# Patient Record
Sex: Male | Born: 1990 | Race: White | Hispanic: Yes | Smoking: Never smoker
Health system: Southern US, Community
[De-identification: ages and names within clinical notes are randomized; demographics above are authoritative.]

## PROBLEM LIST (undated history)

## (undated) DIAGNOSIS — Q676 Pectus excavatum: Secondary | ICD-10-CM

## (undated) DIAGNOSIS — R000 Tachycardia, unspecified: Secondary | ICD-10-CM

## (undated) DIAGNOSIS — R748 Abnormal levels of other serum enzymes: Secondary | ICD-10-CM

## (undated) HISTORY — DX: Abnormal levels of other serum enzymes: R74.8

## (undated) HISTORY — DX: Tachycardia, unspecified: R00.0

## (undated) HISTORY — DX: Pectus excavatum: Q67.6

---

## 2018-11-09 ENCOUNTER — Other Ambulatory Visit: Payer: Self-pay

## 2018-11-09 DIAGNOSIS — Z20822 Contact with and (suspected) exposure to covid-19: Secondary | ICD-10-CM

## 2018-11-10 LAB — NOVEL CORONAVIRUS, NAA: SARS-CoV-2, NAA: NOT DETECTED

## 2018-11-28 ENCOUNTER — Other Ambulatory Visit: Payer: Self-pay

## 2018-11-28 DIAGNOSIS — Z20822 Contact with and (suspected) exposure to covid-19: Secondary | ICD-10-CM

## 2018-11-29 LAB — NOVEL CORONAVIRUS, NAA: SARS-CoV-2, NAA: NOT DETECTED

## 2018-12-10 ENCOUNTER — Encounter (HOSPITAL_COMMUNITY): Payer: Self-pay

## 2018-12-10 ENCOUNTER — Emergency Department (HOSPITAL_COMMUNITY)
Admission: EM | Admit: 2018-12-10 | Discharge: 2018-12-11 | Disposition: A | Discharge status: Home / Self Care | Payer: Self-pay | Attending: Emergency Medicine | Admitting: Emergency Medicine

## 2018-12-10 ENCOUNTER — Emergency Department (HOSPITAL_COMMUNITY): Payer: Self-pay

## 2018-12-10 DIAGNOSIS — U071 COVID-19: Secondary | ICD-10-CM | POA: Insufficient documentation

## 2018-12-10 LAB — CBC WITH DIFFERENTIAL/PLATELET
Abs Immature Granulocytes: 0.01 10*3/uL (ref 0.00–0.07)
Basophils Absolute: 0 10*3/uL (ref 0.0–0.1)
Basophils Relative: 0 %
Eosinophils Absolute: 0 10*3/uL (ref 0.0–0.5)
Eosinophils Relative: 0 %
HCT: 45.6 % (ref 39.0–52.0)
Hemoglobin: 16.1 g/dL (ref 13.0–17.0)
Immature Granulocytes: 0 %
Lymphocytes Relative: 10 %
Lymphs Abs: 0.8 10*3/uL (ref 0.7–4.0)
MCH: 31.4 pg (ref 26.0–34.0)
MCHC: 35.3 g/dL (ref 30.0–36.0)
MCV: 89.1 fL (ref 80.0–100.0)
Monocytes Absolute: 0.7 10*3/uL (ref 0.1–1.0)
Monocytes Relative: 9 %
Neutro Abs: 5.9 10*3/uL (ref 1.7–7.7)
Neutrophils Relative %: 81 %
Platelets: 256 10*3/uL (ref 150–400)
RBC: 5.12 MIL/uL (ref 4.22–5.81)
RDW: 11.9 % (ref 11.5–15.5)
WBC: 7.3 10*3/uL (ref 4.0–10.5)
nRBC: 0 % (ref 0.0–0.2)

## 2018-12-10 LAB — BASIC METABOLIC PANEL
Anion gap: 10 (ref 5–15)
BUN: 9 mg/dL (ref 6–20)
CO2: 23 mmol/L (ref 22–32)
Calcium: 9.1 mg/dL (ref 8.9–10.3)
Chloride: 103 mmol/L (ref 98–111)
Creatinine, Ser: 0.91 mg/dL (ref 0.61–1.24)
GFR calc Af Amer: >60 mL/min (ref >60)
GFR calc non Af Amer: >60 mL/min (ref >60)
Glucose, Bld: 114 mg/dL — ABNORMAL HIGH (ref 70–99)
Potassium: 3.6 mmol/L (ref 3.5–5.1)
Sodium: 136 mmol/L (ref 135–145)

## 2018-12-10 MED ORDER — SODIUM CHLORIDE 0.9 % IV BOLUS
500.0000 mL | Freq: Once | INTRAVENOUS | Status: AC
  Administered 2018-12-10: 500 mL via INTRAVENOUS

## 2018-12-10 MED ORDER — FLUTICASONE PROPIONATE 50 MCG/ACT NA SUSP: 1.0000 | Freq: Every day | NASAL | 0 refills | Status: DC

## 2018-12-10 MED ORDER — ACETAMINOPHEN 500 MG PO TABS
1000.0000 mg | ORAL_TABLET | Freq: Once | ORAL | Status: AC
  Administered 2018-12-10: 1000 mg via ORAL
  Filled 2018-12-10: qty 2

## 2018-12-10 NOTE — Discharge Instructions (Signed)
Use Flonase once daily.  You can also use Arm and Hammer Simply Saline as needed for nasal congestion. Make sure to stay well hydrated. You can alternate ibuprofen and Tylenol as prescribed over-the-counter for your fever. Please return to the emergency department if you develop and new or worsening symptoms including severe chest pain or shortness of breath, racing heart that is not resolving with control of fever and hydrating, or any other concerning symptoms.

## 2018-12-10 NOTE — ED Triage Notes (Signed)
Pt comes via [facility], began to have cough and congestion that started Saturday, Covid positive today at [facility], HR was 140, PTA received 325 ASA. Denies CP or palpations.

## 2018-12-10 NOTE — ED Provider Notes (Signed)
[facility] EMERGENCY DEPARTMENT Provider Note   CSN: [id number] Arrival date & time: 12/10/18  2018     History   Chief Complaint Chief Complaint  Patient presents with  . Tachycardia  . COVID +    HPI James Hubbard is a 28 y.o. male who is previously healthy who presents with tachycardia.  Patient reports testing positive for COVID-19 today.  He has had some associated nasal congestion, little cough, and body aches.  He denies any chest pain, shortness of breath, abdominal pain, nausea, vomiting, diarrhea.  Patient has had loss of taste and smell.  He has been taking some over-the-counter cough medications.  He denies any drug or alcohol use.  His dad tested positive for COVID-19.     HPI  History reviewed. No pertinent past medical history.  There are no active problems to display for this patient.   History reviewed. No pertinent surgical history.      Home Medications    Prior to Admission medications   Medication Sig Start Date End Date Taking? Authorizing Provider  ibuprofen (ADVIL) 200 MG tablet Take 400 mg by mouth every 6 (six) hours as needed for moderate pain.   Yes [provider]  Phenylephrine-APAP-guaiFENesin (MUCINEX SINUS-MAX PO) Take 2 tablets by mouth as needed (sinus).   Yes [provider]  Pseudoephedrine-APAP-DM (DAYQUIL PO) Take 1 Dose by mouth as needed (cold and flu). Liquid   Yes [provider]  fluticasone (FLONASE) 50 MCG/ACT nasal spray Place 1 spray into both nostrils daily. 12/10/18   Emi Holes, PA-C    Family History No family history on file.  Social History Social History   Tobacco Use  . Smoking status: Never Smoker  . Smokeless tobacco: Never Used  Substance Use Topics  . Alcohol use: Never    Frequency: Never  . Drug use: Never     Allergies   Patient has no known allergies.   Review of Systems Review of Systems  Constitutional: Negative for chills and  fever.  HENT: Positive for congestion. Negative for facial swelling and sore throat.   Respiratory: Positive for cough. Negative for shortness of breath.   Cardiovascular: Positive for palpitations. Negative for chest pain.  Gastrointestinal: Negative for abdominal pain, nausea and vomiting.  Genitourinary: Negative for dysuria.  Musculoskeletal: Positive for myalgias. Negative for back pain.  Skin: Negative for rash and wound.  Neurological: Negative for headaches.  Psychiatric/Behavioral: The patient is not nervous/anxious.      Physical Exam Updated Vital Signs BP 121/85   Pulse (!) 110   Temp 99.9 F (37.7 C) (Oral)   Resp 13   SpO2 99%   Physical Exam Vitals signs and nursing note reviewed.  Constitutional:      General: He is not in acute distress.    Appearance: He is well-developed. He is not diaphoretic.  HENT:     Head: Normocephalic and atraumatic.     Right Ear: Tympanic membrane normal.     Left Ear: Tympanic membrane normal.     Mouth/Throat:     Pharynx: No oropharyngeal exudate.  Eyes:     General: No scleral icterus.       Right eye: No discharge.        Left eye: No discharge.     Conjunctiva/sclera: Conjunctivae normal.     Pupils: Pupils are equal, round, and reactive to light.  Neck:     Musculoskeletal: Normal range of motion and neck supple.  Thyroid: No thyromegaly.  Cardiovascular:     Rate and Rhythm: Regular rhythm. Tachycardia present.     Heart sounds: Normal heart sounds. No murmur. No friction rub. No gallop.   Pulmonary:     Effort: Pulmonary effort is normal. No respiratory distress.     Breath sounds: Normal breath sounds. No stridor. No wheezing or rales.  Abdominal:     General: Bowel sounds are normal. There is no distension.     Palpations: Abdomen is soft.     Tenderness: There is no abdominal tenderness. There is no guarding or rebound.  Lymphadenopathy:     Cervical: No cervical adenopathy.  Skin:    General: Skin is  warm and dry.     Coloration: Skin is not pale.     Findings: No rash.  Neurological:     Mental Status: He is alert.     Coordination: Coordination normal.      ED Treatments / Results  Labs (all labs ordered are listed, but only abnormal results are displayed) Labs Reviewed  BASIC METABOLIC PANEL - Abnormal; Notable for the following components:      Result Value   Glucose, Bld 114 (*)    All other components within normal limits  CBC WITH DIFFERENTIAL/PLATELET    EKG None  Radiology Dg Chest Portable 1 View  Result Date: 12/10/2018 CLINICAL DATA:  Cough, COVID-19 EXAM: PORTABLE CHEST 1 VIEW COMPARISON:  02/16/2015 FINDINGS: The heart size and mediastinal contours are within normal limits. Both lungs are clear. The visualized skeletal structures are unremarkable. IMPRESSION: No acute abnormality of the lungs in AP portable projection. Electronically Signed   By: Eddie Candle M.D.   On: 12/10/2018 21:33    Procedures Procedures (including critical care time)  Medications Ordered in ED Medications  acetaminophen (TYLENOL) tablet 1,000 mg (1,000 mg Oral Given 12/10/18 2056)  sodium chloride 0.9 % bolus 500 mL (500 mLs Intravenous New Bag/Given 12/10/18 2217)     Initial Impression / Assessment and Plan / ED Course  I have reviewed the triage vital signs and the nursing notes.  Pertinent labs & imaging results that were available during my care of the patient were reviewed by me and considered in my medical decision making (see chart for details).        Patient with COVID-19 infection.  He was found to have heart rate in the 130s and 140s at urgent care.  He was sent here for further evaluation.  Patient's heart rate has improved after control of his fever and IV fluids.  Labs are unremarkable.  Chest x-ray is clear.  EKG shows sinus tachycardia.  His heart rate has improved to low 100s.  Patient is otherwise asymptomatic.  Quarantine discussed.  Flonase for nasal  congestion.  Alternation of ibuprofen and Tylenol discussed for fever.  Good hydration discussed.  Return precautions discussed.  Patient understands agrees with plan.  Patient vital stable and discharged in satisfactory condition.  I discussed patient case with Dr. Wilson Singer who guided the patient's management and agrees with plan.   Final Clinical Impressions(s) / ED Diagnoses   Final diagnoses:  [id number] virus infection    ED Discharge Orders         Ordered    fluticasone (FLONASE) 50 MCG/ACT nasal spray  Daily     12/10/18 2348           Frederica Kuster, PA-C 12/10/18 2358    Virgel Manifold, MD 12/11/18 [phone]

## 2018-12-24 ENCOUNTER — Other Ambulatory Visit: Payer: Self-pay

## 2018-12-24 DIAGNOSIS — Z20822 Contact with and (suspected) exposure to covid-19: Secondary | ICD-10-CM

## 2018-12-25 LAB — NOVEL CORONAVIRUS, NAA: SARS-CoV-2, NAA: NOT DETECTED

## 2020-08-19 ENCOUNTER — Encounter: Payer: Self-pay | Admitting: Cardiology

## 2020-09-20 DIAGNOSIS — R000 Tachycardia, unspecified: Secondary | ICD-10-CM | POA: Insufficient documentation

## 2020-09-20 NOTE — Progress Notes (Deleted)
Cardiology Office Note:    Date:  09/21/2020   ID:  James Hubbard, DOB 08/16/1990, MRN [id number]  PCP:  Patient, No Pcp Per (Inactive)  Cardiologist:  Norman Herrlich, MD   Referring MD: Buckner Malta, MD  ASSESSMENT:    1. Pectus excavatum   2. Tachycardia    PLAN:    In order of problems listed above:  ***  Next appointment   Medication Adjustments/Labs and Tests Ordered: Current medicines are reviewed at length with the patient today.  Concerns regarding medicines are outlined above.  No orders of the defined types were placed in this encounter.  No orders of the defined types were placed in this encounter.    No chief complaint on file. ***  History of Present Illness:    James Hubbard is a 30 y.o. male with a history of pectus excavatum deformity who is being seen today for the evaluation of tachycardia at the request of Buckner Malta, MD. I Reviewed office records from his PCP when seen 08/14/2019 his resting heart rate is 116 bpm respiratory rate 20 blood pressure 118/72. An EKG performed showed sinus tachycardia low voltage in the precordial leads.  Laboratory studies showed a hemoglobin normal 15.5 platelets 372,000 CMP with a creatinine 0.9 GFR greater than 90 cc potassium 3.8 sodium 141 otherwise normal free T4 TSH were normal.  He had chest x-ray performed 12/10/2018 single AP projection was read as normal.  He was seen in consultation at [facility] 09/15/2020 both pediatric cardiology and cardiothoracic surgery advised surgical correction Nuss procedure at that visit his heart rate was 89 bpm blood pressure 110/70 and was described as having a pectus excavatum deformity with chest asymmetry and tenderness over the left fourth and fifth costal chondral junction.  He was recommended to have a CT scan PFT's and TTE to further evaluation with his desire for surgical intervention.  He has arrangements to follow-up with cardiothoracic surgery  Dr.Upham 09/30/2020  Past Medical History:  Diagnosis Date   Congenital pectus excavatum    Elevated liver enzymes    Tachycardia     No past surgical history on file.  Current Medications: No outpatient medications have been marked as taking for the 09/21/20 encounter (Appointment) with Baldo Daub, MD.     Allergies:   Patient has no known allergies.   Social History   Socioeconomic History   Marital status: Significant Other    Spouse name: Not on file   Number of children: Not on file   Years of education: Not on file   Highest education level: Not on file  Occupational History   Not on file  Tobacco Use   Smoking status: Never   Smokeless tobacco: Never  Substance and Sexual Activity   Alcohol use: Never   Drug use: Never   Sexual activity: Not on file  Other Topics Concern   Not on file  Social History Narrative   Not on file   Social Determinants of Health   Financial Resource Strain: Not on file  Food Insecurity: Not on file  Transportation Needs: Not on file  Physical Activity: Not on file  Stress: Not on file  Social Connections: Not on file     Family History: The patient's ***family history is not on file.  ROS:   ROS Please see the history of present illness.    *** All other systems reviewed and are negative.  EKGs/Labs/Other Studies Reviewed:    The following studies were reviewed today: ***  EKG:  EKG is *** ordered today.  The ekg ordered today is personally reviewed and demonstrates ***  Recent Labs: No results found for requested labs within last 8760 hours.  Recent Lipid Panel No results found for: CHOL, TRIG, HDL, CHOLHDL, VLDL, LDLCALC, LDLDIRECT  Physical Exam:    VS:  There were no vitals taken for this visit.    Wt Readings from Last 3 Encounters:  08/13/20 153 lb (69.4 kg)     GEN: *** Well nourished, well developed in no acute distress HEENT: Normal NECK: No JVD; No carotid bruits LYMPHATICS: No  lymphadenopathy CARDIAC: ***RRR, no murmurs, rubs, gallops RESPIRATORY:  Clear to auscultation without rales, wheezing or rhonchi  ABDOMEN: Soft, non-tender, non-distended MUSCULOSKELETAL:  No edema; No deformity  SKIN: Warm and dry NEUROLOGIC:  Alert and oriented x 3 PSYCHIATRIC:  Normal affect     Signed, Norman Herrlich, MD  09/21/2020 12:41 PM     [facility]

## 2020-09-21 ENCOUNTER — Ambulatory Visit: Payer: 59 | Admitting: Cardiology

## 2020-09-21 DIAGNOSIS — Q676 Pectus excavatum: Secondary | ICD-10-CM

## 2020-09-21 DIAGNOSIS — R000 Tachycardia, unspecified: Secondary | ICD-10-CM

## 2021-05-21 IMAGING — DX DG CHEST 1V PORT
1 series · 1 of 1 positions shown · non-contrast
Comparison: 02/16/2015

CLINICAL DATA: Cough, [id number]

EXAM:
PORTABLE CHEST 1 VIEW

[chest ap]
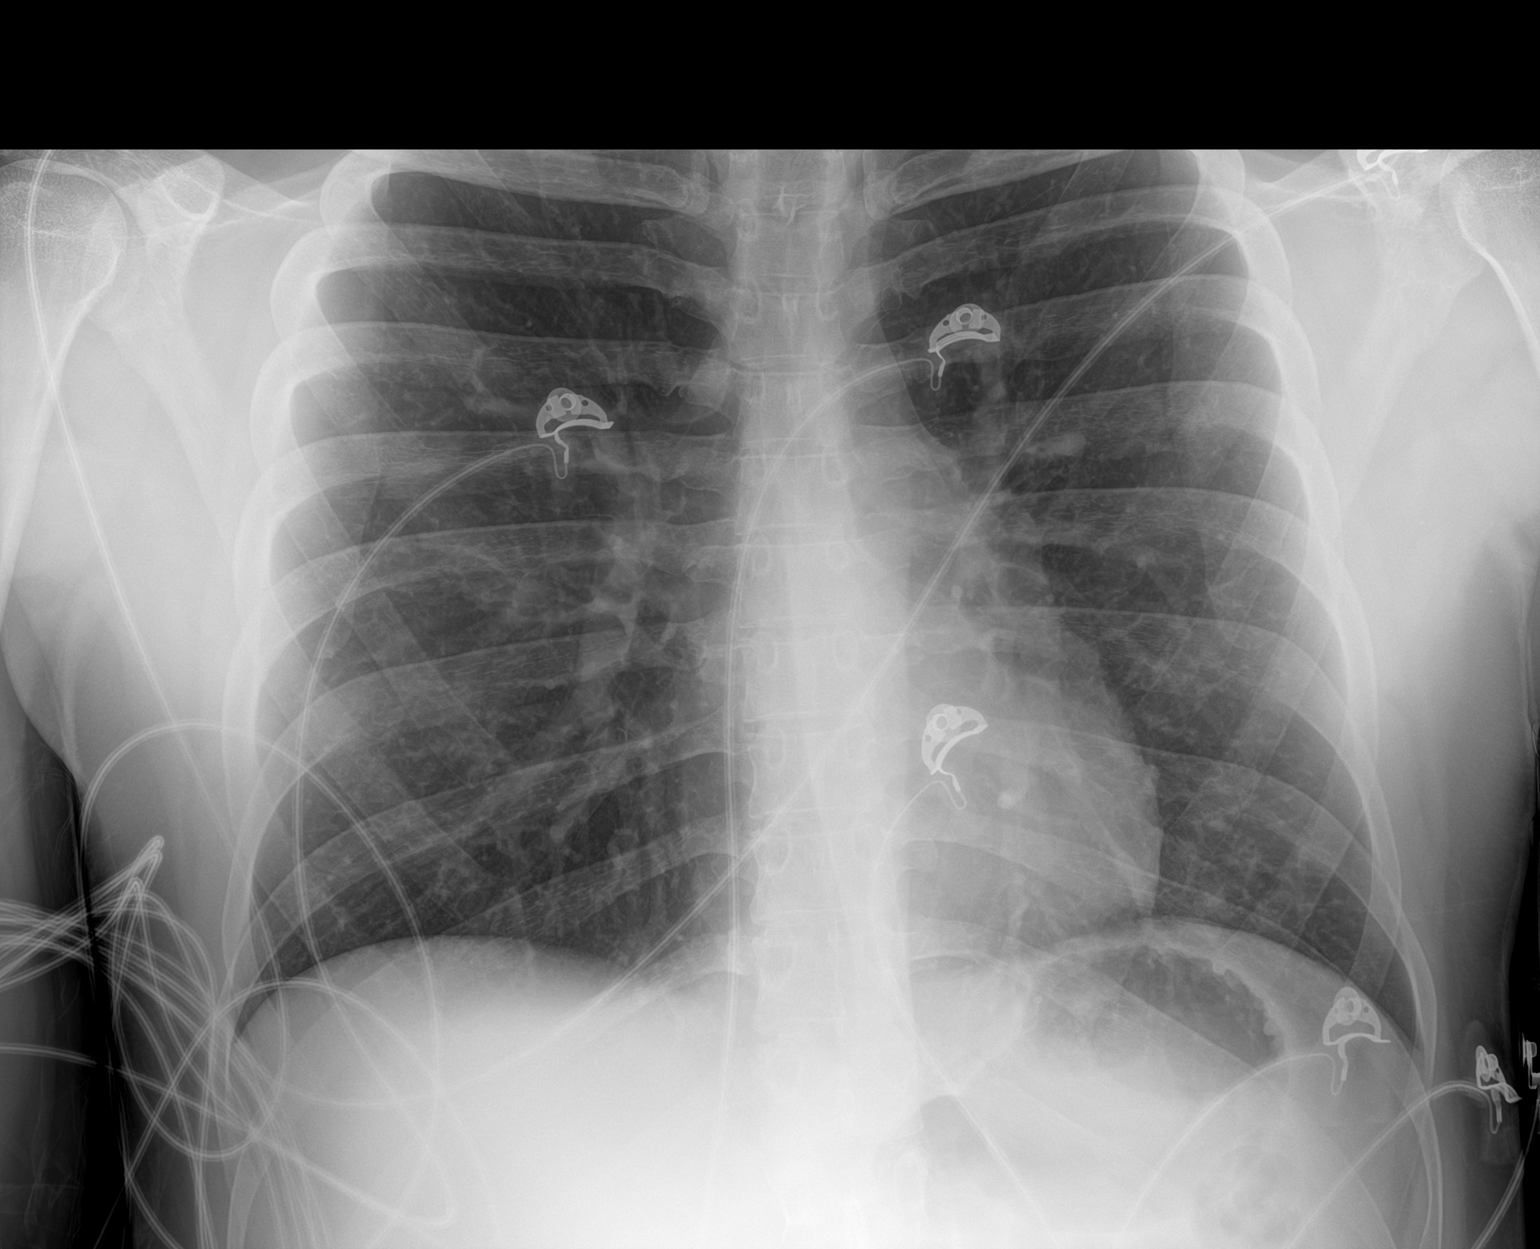

[1 of 1 positions shown; findings below may reference images not displayed]

FINDINGS: The heart size and mediastinal contours are within normal limits.
Both lungs are clear. The visualized skeletal structures are
unremarkable.
IMPRESSION: No acute abnormality of the lungs in AP portable projection.

## 2023-01-11 ENCOUNTER — Ambulatory Visit: Payer: No Typology Code available for payment source | Admitting: Podiatry

## 2023-01-12 ENCOUNTER — Ambulatory Visit (INDEPENDENT_AMBULATORY_CARE_PROVIDER_SITE_OTHER): Payer: No Typology Code available for payment source

## 2023-01-12 ENCOUNTER — Ambulatory Visit: Payer: No Typology Code available for payment source | Admitting: Podiatry

## 2023-01-12 DIAGNOSIS — M778 Other enthesopathies, not elsewhere classified: Secondary | ICD-10-CM | POA: Diagnosis not present

## 2023-01-12 DIAGNOSIS — M722 Plantar fascial fibromatosis: Secondary | ICD-10-CM

## 2023-01-12 NOTE — Patient Instructions (Signed)

## 2023-01-12 NOTE — Progress Notes (Signed)
     Chief Complaint  Patient presents with   Foot Pain    Left heel pain in the back of the ankle/heel area that radiates up into left knee. Ongoing x1 week. No known injury. Right heel pain is just in the heel area. Ongoing x1-2 weeks.    HPI: 32 y.o. male presenting today with c/o pain in the bottom of the bilateral heel.  Pain is rated as 8/10.  This has been present for the past couple weeks.  He notes the pain is starting to wrap around to the Achilles tendon area.  Denies injury.  Past Medical History:  Diagnosis Date   Congenital pectus excavatum    Elevated liver enzymes    Tachycardia     No past surgical history on file.  No Known Allergies   Physical Exam: General: The patient is alert and oriented x3 in no acute distress.  Dermatology:  No ecchymosis, erythema, or edema bilateral.  No open lesions.    Vascular: Palpable pedal pulses bilaterally. Capillary refill within normal limits.  No appreciable edema.    Neurological: Light touch sensation intact bilateral.  MMT 5/5 to lower extremity bilateral. Negative Tinel's sign with percussion of the posterior tibial nerve on the affected extremity.    Musculoskeletal Exam:  There is pain on palpation of the plantarmedial & plantarcentral aspect of bilateral heel.  No gaps or nodules within the plantar fascia.  Positive Windlass mechanism bilateral.  Antalgic gait noted with first few steps upon standing.  Minimal pain on palpation of distal achilles tendon bilateral.  Ankle df less than 10 degrees with knee extended b/l.  Radiographic Exam (bilateral foot, 3 weightbearing views, 01/12/2023):  Normal osseous mineralization.  Right foot has long first metatarsal and decreased calcaneal inclination angle with first ray elevatus noted.  Left foot has decreased calcaneal inclination angle.  No fracture is seen on either foot.  No calcaneal spur is seen on either foot.  Assessment/Plan of Care: 1. Plantar fasciitis, bilateral    2. Capsulitis of foot     -Reviewed etiology of plantar fasciitis with patient.  Discussed treatment options with patient today, including cortisone injection, NSAID course of treatment, stretching exercises, physical therapy, use of night splint, rest, icing the heel, arch supports/orthotics, and supportive shoe gear.    Patient wants to hold off on cortisone injection, physical therapy, night splint, NSAIDs.  Power step inserts were fitted and dispensed today.  Stretching exercises were printed and dispensed.  Patient is to perform these 2-3 times daily.  Patient is interested in custom orthotics.  Will get him scheduled for an orthotic consult with our pedorthist.  He was given an orthotic estimate to call his insurance and check his benefits.  He will let us know if he would like to proceed after speaking to his insurance. Return for schedule orthotic consult with [name].   Clerance Lav, DPM, FACFAS [facility]     [address] [place], [place] [address]                Office [phone]  Fax [phone]
